# Patient Record
Sex: Female | Born: 1990 | Race: White | Hispanic: No | Marital: Single | Smoking: Never smoker
Health system: Southern US, Community
[De-identification: ages and names within clinical notes are randomized; demographics above are authoritative.]

## PROBLEM LIST (undated history)

## (undated) DIAGNOSIS — N390 Urinary tract infection, site not specified: Secondary | ICD-10-CM

## (undated) DIAGNOSIS — Z820 Family history of epilepsy and other diseases of the nervous system: Secondary | ICD-10-CM

## (undated) HISTORY — DX: Family history of epilepsy and other diseases of the nervous system: Z82.0

## (undated) HISTORY — DX: Urinary tract infection, site not specified: N39.0

---
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
## 2012-01-05 ENCOUNTER — Other Ambulatory Visit: Payer: Self-pay | Admitting: Family Medicine

## 2012-02-06 ENCOUNTER — Telehealth: Payer: Self-pay | Admitting: Family Medicine

## 2012-02-06 NOTE — Telephone Encounter (Signed)
Patient scheduled for an acute appointment with the nurse practitioner on 02/08/12.

## 2012-02-06 NOTE — Telephone Encounter (Signed)
Patient Information:  Caller Name: Anastasya  Phone: [phone]  Patient: Ashley Pitts, Ashley Pitts  Gender: Female  DOB: February 01, 1990  Age: 22 Years  PCP: Evelena Peat (Family Practice)  Pregnant: No  Office Follow Up:  Does the office need to follow up with this patient?: Yes  Instructions For The Office: Pt would like appt on Friday.  Triage nurse not allowed to schedule that far in advance.  Pt said she can come at anytime on Friday.  OFFICE PLEASE CALL PT BACK AT [phone] TO ADVISE IF SHE CAN BE SEEN ON FRIDAY.  Thanks.  RN Note:  Emergent symptoms r/o by Fatigue guidelines with exception of unusually frequent amount of urination or increased thirst.  (See Provider Within 72 Hours).  Pt requesting appt for Friday.  [facility] not able to schedule that far out.  Will send note to office to have them call pt back with possible appt time for Friday.   Symptoms  Reason For Call & Symptoms: Pt calling today 02/06/12 regarding has been episodes of excessive thirst, shakiness, weight loss and the gain back.  Reviewed Health History In EMR: Yes  Reviewed Medications In EMR: Yes  Reviewed Allergies In EMR: Yes  Reviewed Surgeries / Procedures: Yes  Date of Onset of Symptoms: 12/02/2011  Treatments Tried: changed her diet.  Eating more salads and protein  Treatments Tried Worked: No OB / GYN:  LMP: 12/28/2011  Guideline(s) Used:  No Protocol Available - Sick Adult  Disposition Per Guideline:   See Within 3 Days in Office  Reason For Disposition Reached:   Nursing judgment  Advice Given:  N/A

## 2012-02-08 ENCOUNTER — Encounter: Payer: Self-pay | Admitting: Family

## 2012-02-08 ENCOUNTER — Ambulatory Visit (INDEPENDENT_AMBULATORY_CARE_PROVIDER_SITE_OTHER): Payer: BC Managed Care – PPO | Admitting: Family

## 2012-02-08 VITALS — BP 110/60 | HR 90 | Wt 150.0 lb

## 2012-02-08 DIAGNOSIS — Z8659 Personal history of other mental and behavioral disorders: Secondary | ICD-10-CM

## 2012-02-08 DIAGNOSIS — R631 Polydipsia: Secondary | ICD-10-CM

## 2012-02-08 DIAGNOSIS — F411 Generalized anxiety disorder: Secondary | ICD-10-CM

## 2012-02-08 DIAGNOSIS — F419 Anxiety disorder, unspecified: Secondary | ICD-10-CM

## 2012-02-08 DIAGNOSIS — N926 Irregular menstruation, unspecified: Secondary | ICD-10-CM

## 2012-02-08 LAB — POCT URINE PREGNANCY: Preg Test, Ur: NEGATIVE

## 2012-02-08 LAB — GLUCOSE, POCT (MANUAL RESULT ENTRY): POC Glucose: 89 mg/dl (ref 70–99)

## 2012-02-08 MED ORDER — BUPROPION HCL ER (XL) 150 MG PO TB24: 150.0000 mg | ORAL_TABLET | Freq: Every day | ORAL | Status: DC

## 2012-02-08 NOTE — Patient Instructions (Addendum)

## 2012-02-08 NOTE — Progress Notes (Signed)
Subjective:    Patient ID: Ashley Pitts, female    DOB: 25-Feb-1990, 22 y.o.   MRN: [id number]  HPI  Pt is a 22 year old white female,non smoker, patient of Dr. Caryl Never, presents to the office today for pregnancy test; states missed menstrual cycle in Jan. She was sexually active in December, was not on BCP however did use condom for protection. She did have a period in December relating that it was heavier than normal. She began BCP's at the first of January and did not have a period at the end of the January as anticipated. Expressed concern that in the last month she has had increased thirst and increased urination. She has a family history of diabetes; Maternal grandmother and grandfather have diabetes. Mother does not. She verbalizes weight gain since high school which has created anxiety and worry. She has a history of eating disorder that included Bulemia and purging. Last episode March 2012. She sees a Veterinary surgeon with on going therapy for eating disorder. Family history of anxiety with maternal grandparents, sister has anxiety disorder and is on antianxiety medications.   Review of Systems  Constitutional: Negative.   HENT: Negative.   Respiratory: Negative.   Cardiovascular: Negative.   Gastrointestinal: Negative.   Genitourinary: Negative.   Musculoskeletal: Negative.   Skin: Negative.   Neurological: Negative.   Hematological: Negative.   Psychiatric/Behavioral: Negative.    Past Medical History  Diagnosis Date  . Asthma   . FHx: migraine headaches   . UTI (lower urinary tract infection)     History   Social History  . Marital Status: Single    Spouse Name: N/A    Number of Children: N/A  . Years of Education: N/A   Occupational History  . Not on file.   Social History Main Topics  . Smoking status: Never Smoker   . Smokeless tobacco: Not on file  . Alcohol Use: No  . Drug Use: Not on file  . Sexually Active: Not on file   Other Topics Concern  . Not on file    Social History Narrative  . No narrative on file    Past Surgical History  Procedure Date  . Tonsillectomy 1999    Family History  Problem Relation Age of Onset  . Hypertension Father   . Hypertension Maternal Grandmother   . Hyperlipidemia Maternal Grandmother   . Diabetes Maternal Grandmother     type 11  . Hypertension Maternal Grandfather   . Hyperlipidemia Maternal Grandfather   . Diabetes Maternal Grandfather     type ll  . Depression Maternal Grandfather   . Hypertension Paternal Grandmother   . Hypertension Paternal Grandfather     Allergies  Allergen Reactions  . Imitrex (Sumatriptan Succinate)     Tongue swollen, trouble breathing    Current Outpatient Prescriptions on File Prior to Visit  Medication Sig Dispense Refill  . aspirin-acetaminophen-caffeine (EXCEDRIN MIGRAINE) 250-250-65 MG per tablet Take 1 tablet by mouth every 6 (six) hours as needed.        . norethindrone-ethinyl estradiol (JUNEL FE,GILDESS FE,LOESTRIN FE) 1-20 MG-MCG tablet Take 1 tablet by mouth daily.      . RELPAX 40 MG tablet TAKE AS DIRECTED  10 tablet  0  . albuterol (PROAIR HFA) 108 (90 BASE) MCG/ACT inhaler Inhale 2 puffs into the lungs every 6 (six) hours as needed for wheezing.  1 Inhaler  2  . buPROPion (WELLBUTRIN XL) 150 MG 24 hr tablet Take 1 tablet (150  mg total) by mouth daily.  30 tablet  1    BP 110/60  Pulse 90  Wt 150 lb (68.04 kg)  SpO2 97%chart    Objective:   Physical Exam  Constitutional: She is oriented to person, place, and time. She appears well-developed and well-nourished.  Neck: Neck supple.  Cardiovascular: Normal rate, regular rhythm and normal heart sounds.   Pulmonary/Chest: Effort normal and breath sounds normal.  Abdominal: Soft. There is no tenderness. There is no rebound and no guarding.  Neurological: She is alert and oriented to person, place, and time.  Skin: Skin is warm and dry.  Psychiatric: She has a normal mood and affect.    Urine  pregnancy test performed with negative results   Blood sugar obtained with a result of 89-nonfasting (last meal 4 hours ago)      Assessment & Plan:  Assessment:   1. Anxiety Disorder 2. History Eating Disorder    Plan: Discussed anxiety and its effects.Education provided on side effects of BCP and potential weight gain, missing menstrual cycle.  Trial of Welbutrin XL 150 daily times 30 days. Follow up in 3 weeks for effectiveness of medications. Continue psychotherapy.

## 2012-02-29 ENCOUNTER — Ambulatory Visit: Payer: BC Managed Care – PPO | Admitting: Family

## 2012-05-30 ENCOUNTER — Ambulatory Visit (INDEPENDENT_AMBULATORY_CARE_PROVIDER_SITE_OTHER): Payer: BC Managed Care – PPO | Admitting: Family Medicine

## 2012-05-30 ENCOUNTER — Encounter: Payer: Self-pay | Admitting: Family Medicine

## 2012-05-30 VITALS — BP 130/82 | Temp 98.0°F | Wt 149.0 lb

## 2012-05-30 DIAGNOSIS — N390 Urinary tract infection, site not specified: Secondary | ICD-10-CM

## 2012-05-30 DIAGNOSIS — R300 Dysuria: Secondary | ICD-10-CM

## 2012-05-30 LAB — POCT URINALYSIS DIPSTICK
Glucose, UA: NEGATIVE
Nitrite, UA: NEGATIVE
Protein, UA: NEGATIVE
Spec Grav, UA: <=1.005

## 2012-05-30 LAB — POCT URINE PREGNANCY: Preg Test, Ur: NEGATIVE

## 2012-05-30 MED ORDER — NITROFURANTOIN MONOHYD MACRO 100 MG PO CAPS: 100.0000 mg | ORAL_CAPSULE | Freq: Two times a day (BID) | ORAL | Status: DC

## 2012-05-30 MED ORDER — ELETRIPTAN HYDROBROMIDE 40 MG PO TABS: 40.0000 mg | ORAL_TABLET | ORAL | Status: DC | PRN

## 2012-05-30 NOTE — Patient Instructions (Addendum)
Urinary Tract Infection  Urinary tract infections (UTIs) can develop anywhere along your urinary tract. Your urinary tract is your body's drainage system for removing wastes and extra water. Your urinary tract includes two kidneys, two ureters, a bladder, and a urethra. Your kidneys are a pair of bean-shaped organs. Each kidney is about the size of your fist. They are located below your ribs, one on each side of your spine.  CAUSES  Infections are caused by microbes, which are microscopic organisms, including fungi, viruses, and bacteria. These organisms are so small that they can only be seen through a microscope. Bacteria are the microbes that most commonly cause UTIs.  SYMPTOMS   Symptoms of UTIs may vary by age and gender of the patient and by the location of the infection. Symptoms in young women typically include a frequent and intense urge to urinate and a painful, burning feeling in the bladder or urethra during urination. Older women and men are more likely to be tired, shaky, and weak and have muscle aches and abdominal pain. A fever may mean the infection is in your kidneys. Other symptoms of a kidney infection include pain in your back or sides below the ribs, nausea, and vomiting.  DIAGNOSIS  To diagnose a UTI, your caregiver will ask you about your symptoms. Your caregiver also will ask to provide a urine sample. The urine sample will be tested for bacteria and white blood cells. White blood cells are made by your body to help fight infection.  TREATMENT   Typically, UTIs can be treated with medication. Because most UTIs are caused by a bacterial infection, they usually can be treated with the use of antibiotics. The choice of antibiotic and length of treatment depend on your symptoms and the type of bacteria causing your infection.  HOME CARE INSTRUCTIONS   If you were prescribed antibiotics, take them exactly as your caregiver instructs you. Finish the medication even if you feel better after you  have only taken some of the medication.   Drink enough water and fluids to keep your urine clear or pale yellow.   Avoid caffeine, tea, and carbonated beverages. They tend to irritate your bladder.   Empty your bladder often. Avoid holding urine for long periods of time.   Empty your bladder before and after sexual intercourse.   After a bowel movement, women should cleanse from front to back. Use each tissue only once.  SEEK MEDICAL CARE IF:    You have back pain.   You develop a fever.   Your symptoms do not begin to resolve within 3 days.  SEEK IMMEDIATE MEDICAL CARE IF:    You have severe back pain or lower abdominal pain.   You develop chills.   You have nausea or vomiting.   You have continued burning or discomfort with urination.  MAKE SURE YOU:    Understand these instructions.   Will watch your condition.   Will get help right away if you are not doing well or get worse.  Document Released: 09/27/2004 Document Revised: 06/19/2011 Document Reviewed: 01/26/2011  ExitCare Patient Information 2014 [employer].

## 2012-05-30 NOTE — Progress Notes (Signed)
  Subjective:    Patient ID: Ashley Pitts, female    DOB: 04-23-1990, 22 y.o.   MRN: [id number]  HPI UTI symptoms past 3 days Frequency and burning with urination. Denies back pain. No nausea or vomiting. No abdominal pain. No fever or chills. No known drug allergies other than intolerance to Imitrex  Also requesting pregnancy test. She does take birth control that had one episode of intercourse 3 weeks ago. Last menstrual period was about 3 weeks ago and normal   Review of Systems  Constitutional: Negative for fever, chills and appetite change.  Gastrointestinal: Negative for nausea, vomiting, abdominal pain, diarrhea and constipation.  Genitourinary: Positive for dysuria and frequency.  Musculoskeletal: Negative for back pain.  Neurological: Negative for dizziness.       Objective:   Physical Exam  Constitutional: She appears well-developed and well-nourished.  HENT:  Head: Normocephalic and atraumatic.  Neck: Neck supple. No thyromegaly present.  Cardiovascular: Normal rate, regular rhythm and normal heart sounds.   Pulmonary/Chest: Breath sounds normal.  Abdominal: Soft. Bowel sounds are normal. There is no tenderness.          Assessment & Plan:  Uncomplicated cystitis. Macrobid one twice a day for 3 days. Plenty of fluids. Urine pregnancy negative.

## 2012-06-02 ENCOUNTER — Telehealth: Payer: Self-pay | Admitting: Family Medicine

## 2012-06-02 NOTE — Telephone Encounter (Signed)
[facility] Triage Call Report Triage Record Num: [id number] Operator: Jeraldine Loots Patient Name: Ashley Pitts Call Date & Time: 05/31/2012 8:35:27AM Patient Phone: [phone] PCP: Patient Gender: Female PCP Fax : Patient DOB: 1990/10/22 [facility] Name: [facility] Reason for Call: Caller: Joy/Mother; PCP: Evelena Peat (Family Practice); CB#: [phone]. Was seen in the office on Friday 5/30 and dx with UTI. Started on Macrodantin. This am has extreme lower back pain, radiating into her upper back. She cancelled a flight to [place] because she is feeling so bad. She is asking for IV's and pain medications. At the time of the call, mom had her in the car going to UC. I checked for an appt. at [facility] and the earliest available is 1215. Mom is going to take her on to UC now for evaluation. Protocol(s) Used: Office Note Recommended Outcome per Protocol: Information Noted and Sent to Office Reason for Outcome: Caller information to office Care Advice: ~ 05/

## 2012-07-01 ENCOUNTER — Telehealth: Payer: Self-pay | Admitting: Family Medicine

## 2012-07-01 NOTE — Telephone Encounter (Signed)
Mom following up on letter faxed to MD for pt's school in reference to her migraines.  Pls advise.

## 2012-07-01 NOTE — Telephone Encounter (Signed)
Called pt mother to clarify the letter she is talking about

## 2012-07-03 ENCOUNTER — Other Ambulatory Visit: Payer: Self-pay

## 2012-07-03 NOTE — Telephone Encounter (Signed)
Have form.

## 2012-07-09 ENCOUNTER — Encounter: Payer: Self-pay | Admitting: Family Medicine

## 2012-11-08 ENCOUNTER — Emergency Department (HOSPITAL_BASED_OUTPATIENT_CLINIC_OR_DEPARTMENT_OTHER): Payer: BC Managed Care – PPO

## 2012-11-08 ENCOUNTER — Emergency Department (HOSPITAL_BASED_OUTPATIENT_CLINIC_OR_DEPARTMENT_OTHER)
Admission: EM | Admit: 2012-11-08 | Discharge: 2012-11-08 | Disposition: A | Discharge status: Home / Self Care | Payer: BC Managed Care – PPO | Attending: Emergency Medicine | Admitting: Emergency Medicine

## 2012-11-08 ENCOUNTER — Encounter (HOSPITAL_BASED_OUTPATIENT_CLINIC_OR_DEPARTMENT_OTHER): Payer: Self-pay | Admitting: Emergency Medicine

## 2012-11-08 DIAGNOSIS — N12 Tubulo-interstitial nephritis, not specified as acute or chronic: Secondary | ICD-10-CM | POA: Insufficient documentation

## 2012-11-08 DIAGNOSIS — J45909 Unspecified asthma, uncomplicated: Secondary | ICD-10-CM | POA: Insufficient documentation

## 2012-11-08 DIAGNOSIS — Z87440 Personal history of urinary (tract) infections: Secondary | ICD-10-CM | POA: Insufficient documentation

## 2012-11-08 DIAGNOSIS — R1031 Right lower quadrant pain: Secondary | ICD-10-CM | POA: Insufficient documentation

## 2012-11-08 DIAGNOSIS — Z3202 Encounter for pregnancy test, result negative: Secondary | ICD-10-CM | POA: Insufficient documentation

## 2012-11-08 DIAGNOSIS — R110 Nausea: Secondary | ICD-10-CM | POA: Insufficient documentation

## 2012-11-08 DIAGNOSIS — J029 Acute pharyngitis, unspecified: Secondary | ICD-10-CM | POA: Insufficient documentation

## 2012-11-08 DIAGNOSIS — Z79899 Other long term (current) drug therapy: Secondary | ICD-10-CM | POA: Insufficient documentation

## 2012-11-08 LAB — CBC WITH DIFFERENTIAL/PLATELET
Basophils Absolute: 0 10*3/uL (ref 0.0–0.1)
Eosinophils Absolute: 0 10*3/uL (ref 0.0–0.7)
Eosinophils Relative: 0 % (ref 0–5)
HCT: 39.9 % (ref 36.0–46.0)
Hemoglobin: 13.4 g/dL (ref 12.0–15.0)
Lymphocytes Relative: 15 % (ref 12–46)
Lymphs Abs: 1.8 10*3/uL (ref 0.7–4.0)
MCV: 88.1 fL (ref 78.0–100.0)
Monocytes Absolute: 1.2 10*3/uL — ABNORMAL HIGH (ref 0.1–1.0)
Monocytes Relative: 10 % (ref 3–12)
Neutro Abs: 8.6 10*3/uL — ABNORMAL HIGH (ref 1.7–7.7)
RBC: 4.53 MIL/uL (ref 3.87–5.11)
RDW: 11.8 % (ref 11.5–15.5)
WBC: 11.7 10*3/uL — ABNORMAL HIGH (ref 4.0–10.5)

## 2012-11-08 LAB — BASIC METABOLIC PANEL
BUN: 6 mg/dL (ref 6–23)
CO2: 26 mEq/L (ref 19–32)
Calcium: 10.1 mg/dL (ref 8.4–10.5)
Chloride: 98 mEq/L (ref 96–112)
Creatinine, Ser: 0.8 mg/dL (ref 0.50–1.10)
GFR calc non Af Amer: >90 mL/min (ref >90)
Glucose, Bld: 91 mg/dL (ref 70–99)

## 2012-11-08 LAB — URINALYSIS, ROUTINE W REFLEX MICROSCOPIC
Bilirubin Urine: NEGATIVE
Glucose, UA: NEGATIVE mg/dL
Hgb urine dipstick: NEGATIVE
Specific Gravity, Urine: 1.013 (ref 1.005–1.030)
Urobilinogen, UA: 0.2 mg/dL (ref 0.0–1.0)
pH: 7 (ref 5.0–8.0)

## 2012-11-08 LAB — RAPID STREP SCREEN (MED CTR MEBANE ONLY): Streptococcus, Group A Screen (Direct): NEGATIVE

## 2012-11-08 MED ORDER — SODIUM CHLORIDE 0.9 % IV BOLUS (SEPSIS)
1000.0000 mL | Freq: Once | INTRAVENOUS | Status: AC
  Administered 2012-11-08: 1000 mL via INTRAVENOUS

## 2012-11-08 MED ORDER — MORPHINE SULFATE 4 MG/ML IJ SOLN: 4.0000 mg | INTRAMUSCULAR | Status: DC | PRN

## 2012-11-08 MED ORDER — CEPHALEXIN 500 MG PO CAPS: 500.0000 mg | ORAL_CAPSULE | Freq: Two times a day (BID) | ORAL | Status: DC

## 2012-11-08 MED ORDER — LIDOCAINE VISCOUS 2 % MT SOLN
15.0000 mL | Freq: Once | OROMUCOSAL | Status: AC
  Administered 2012-11-08: 15 mL via OROMUCOSAL
  Filled 2012-11-08: qty 15

## 2012-11-08 MED ORDER — MORPHINE SULFATE 4 MG/ML IJ SOLN: 4.0000 mg | Freq: Once | INTRAMUSCULAR | Status: DC

## 2012-11-08 MED ORDER — ONDANSETRON 8 MG PO TBDP: 8.0000 mg | ORAL_TABLET | Freq: Three times a day (TID) | ORAL | Status: DC | PRN

## 2012-11-08 MED ORDER — IOHEXOL 300 MG/ML  SOLN
50.0000 mL | Freq: Once | INTRAMUSCULAR | Status: AC | PRN
  Administered 2012-11-08: 50 mL via ORAL

## 2012-11-08 MED ORDER — IOHEXOL 300 MG/ML  SOLN
100.0000 mL | Freq: Once | INTRAMUSCULAR | Status: AC | PRN
  Administered 2012-11-08: 100 mL via INTRAVENOUS

## 2012-11-08 MED ORDER — ONDANSETRON HCL 4 MG/2ML IJ SOLN
4.0000 mg | Freq: Once | INTRAMUSCULAR | Status: AC
  Administered 2012-11-08: 4 mg via INTRAVENOUS
  Filled 2012-11-08: qty 2

## 2012-11-08 NOTE — ED Provider Notes (Signed)
CSN: [id number]     Arrival date & time 11/08/12  0902 History   First MD Initiated Contact with Patient 11/08/12 0902     Chief Complaint  Patient presents with  . Urinary Tract Infection  . Sore Throat   (Consider location/radiation/quality/duration/timing/severity/associated sxs/prior Treatment) HPI Comments: Pt comes in with cc of abd pain, back pain. Pt is being treated with macrobid right now, and developed back pain and right sided lower quadrant pain - so she came to the ED. She has dysuria, polyuria, and nausea. She denies any fevers, chills - just feels sick. Pt has no hx of renal stones, and she denies vaginal discharge, or bleeding.  Patient is a 22 y.o. female presenting with urinary tract infection and pharyngitis. The history is provided by the patient.  Urinary Tract Infection Associated symptoms include abdominal pain. Pertinent negatives include no chest pain, no headaches and no shortness of breath.  Sore Throat Associated symptoms include abdominal pain. Pertinent negatives include no chest pain, no headaches and no shortness of breath.    Past Medical History  Diagnosis Date  . Asthma   . FHx: migraine headaches   . UTI (lower urinary tract infection)    Past Surgical History  Procedure Laterality Date  . Tonsillectomy  1999   Family History  Problem Relation Age of Onset  . Hypertension Father   . Hypertension Maternal Grandmother   . Hyperlipidemia Maternal Grandmother   . Diabetes Maternal Grandmother     type 11  . Hypertension Maternal Grandfather   . Hyperlipidemia Maternal Grandfather   . Diabetes Maternal Grandfather     type ll  . Depression Maternal Grandfather   . Hypertension Paternal Grandmother   . Hypertension Paternal Grandfather    History  Substance Use Topics  . Smoking status: Never Smoker   . Smokeless tobacco: Not on file  . Alcohol Use: No   OB History   Grav Para Term Preterm Abortions TAB SAB Ect Mult Living                  Review of Systems  Constitutional: Negative for fever, chills and activity change.  Respiratory: Negative for cough, shortness of breath and wheezing.   Cardiovascular: Negative for chest pain.  Gastrointestinal: Positive for nausea and abdominal pain. Negative for vomiting, diarrhea, constipation, blood in stool and abdominal distention.  Genitourinary: Positive for dysuria, frequency, hematuria and flank pain. Negative for difficulty urinating.  Musculoskeletal: Negative for neck pain.  Skin: Negative for color change.  Neurological: Negative for speech difficulty and headaches.  Hematological: Does not bruise/bleed easily.  Psychiatric/Behavioral: Negative for confusion.    Allergies  Imitrex  Home Medications   Current Outpatient Rx  Name  Route  Sig  Dispense  Refill  . aspirin-acetaminophen-caffeine (EXCEDRIN MIGRAINE) 250-250-65 MG per tablet   Oral   Take 1 tablet by mouth every 6 (six) hours as needed.           . cephALEXin (KEFLEX) 500 MG capsule   Oral   Take 1 capsule (500 mg total) by mouth 2 (two) times daily.   20 capsule   0   . drospirenone-ethinyl estradiol (YAZ,GIANVI,LORYNA) 3-0.02 MG tablet   Oral   Take 1 tablet by mouth daily.         Marland Kitchen eletriptan (RELPAX) 40 MG tablet   Oral   One tablet by mouth at onset of headache. May repeat in 2 hours if headache persists or recurs. may repeat in  2 hours if necessary   10 tablet   11   . nitrofurantoin, macrocrystal-monohydrate, (MACROBID) 100 MG capsule   Oral   Take 100 mg by mouth 2 (two) times daily.         . ondansetron (ZOFRAN ODT) 8 MG disintegrating tablet   Oral   Take 1 tablet (8 mg total) by mouth every 8 (eight) hours as needed for nausea.   20 tablet   0    BP 106/77  Pulse 99  Temp(Src) 98.4 F (36.9 C) (Oral)  Resp 18  Ht 5\' 4"  (1.626 m)  Wt 140 lb (63.504 kg)  BMI 24.02 kg/m2  SpO2 97%  LMP 11/03/2012 Physical Exam  Nursing note and vitals  reviewed. Constitutional: She is oriented to person, place, and time. She appears well-developed and well-nourished.  HENT:  Head: Normocephalic and atraumatic.  Eyes: EOM are normal. Pupils are equal, round, and reactive to light.  Neck: Neck supple.  Cardiovascular: Normal rate, regular rhythm and normal heart sounds.   No murmur heard. Pulmonary/Chest: Effort normal. No respiratory distress.  Abdominal: Soft. She exhibits no distension. There is tenderness. There is no rebound and no guarding.  Periumbilical and RLQ tenderness, + guarding, no rebound.   Neurological: She is alert and oriented to person, place, and time.  Skin: Skin is warm and dry.    ED Course  Procedures (including critical care time) Labs Review Labs Reviewed  URINALYSIS, ROUTINE W REFLEX MICROSCOPIC - Abnormal; Notable for the following:    Color, Urine GREEN (*)    All other components within normal limits  CBC WITH DIFFERENTIAL - Abnormal; Notable for the following:    WBC 11.7 (*)    Neutro Abs 8.6 (*)    Monocytes Absolute 1.2 (*)    All other components within normal limits  RAPID STREP SCREEN  CULTURE, GROUP A STREP  URINE CULTURE  PREGNANCY, URINE  BASIC METABOLIC PANEL   Imaging Review Ct Abdomen Pelvis W Contrast  11/08/2012   CLINICAL DATA:  Right lower quadrant abdominal pain, fever, urinary tract infection and elevated white blood cell count.  EXAM: CT ABDOMEN AND PELVIS WITH CONTRAST  TECHNIQUE: Multidetector CT imaging of the abdomen and pelvis was performed using the standard protocol following bolus administration of intravenous contrast.  CONTRAST:  50mL OMNIPAQUE IOHEXOL 300 MG/ML SOLN, OMNIPAQUE IOHEXOL 300 MG/ML SOLN  COMPARISON:  11/08/2008  FINDINGS: There is no evidence by CT of acute appendicitis. A few small right lower quadrant mesenteric lymph nodes are identified which are nonenlarged. No acute inflammatory process is identified in the abdomen or pelvis.  The liver,  gallbladder, pancreas, spleen, adrenal glands and kidneys have a normal appearance. No hydronephrosis or renal calculi are identified. The bladder is moderately distended. Uterus and adnexal regions are unremarkable by CT. No incidental masses or enlarged lymph nodes are seen. No hernias are identified. Bony structures are within normal limits.  IMPRESSION: No acute findings. No evidence of acute appendicitis. Moderately distended urinary bladder.   Electronically Signed   By: Irish Lack M.D.   On: 11/08/2012 12:48    EKG Interpretation   None       MDM   1. Pyelonephritis    DDx includes: Intra abdominal abscess Thrombosis Mesenteric ischemia Diverticulitis Peritonitis Appendicitis Hernia Nephrolithiasis Pyelonephritis UTI/Cystitis Ovarian cyst TOA Ectopic pregnancy  Pt comes in with cc of abd pain. Has UTI like sx, and associated back pain. Pt's exam reveals periumbilical, RLQ and flank tenderness.  CT abd ordered  - and there is no appendicitis.  Will treat as pyelo.          Derwood Kaplan, MD 11/08/12 1529

## 2012-11-08 NOTE — ED Notes (Signed)
Patient states she had chills last night, hurts to swallow/, no cough,no sinus drainage

## 2012-11-08 NOTE — ED Notes (Signed)
Pt being treated for UTI since Thursday.  Pt continues to have symptoms of back pain, fever, and sore throat.

## 2012-11-08 NOTE — ED Notes (Signed)
Patient not drinking oral contrast, she states her throat hurts to swallow.  I spoke to Dr Rhunette Croft and he will order something to numb her throat so she can drink the contrast.  Patients mom states she had a CT in June for same symptoms and she did not drink oral contrast and CT was negative, mom wants daughter to drink oral contrast today.  Ashley Pitts

## 2012-11-09 LAB — URINE CULTURE: Culture: NO GROWTH

## 2012-11-10 LAB — CULTURE, GROUP A STREP

## 2013-10-29 ENCOUNTER — Encounter: Payer: Self-pay | Admitting: Family Medicine

## 2013-10-29 ENCOUNTER — Ambulatory Visit (INDEPENDENT_AMBULATORY_CARE_PROVIDER_SITE_OTHER): Payer: BC Managed Care – PPO | Admitting: Family Medicine

## 2013-10-29 VITALS — BP 110/70 | HR 80 | Wt 142.0 lb

## 2013-10-29 DIAGNOSIS — R1084 Generalized abdominal pain: Secondary | ICD-10-CM

## 2013-10-29 DIAGNOSIS — R109 Unspecified abdominal pain: Secondary | ICD-10-CM

## 2013-10-29 DIAGNOSIS — R7401 Elevation of levels of liver transaminase levels: Secondary | ICD-10-CM

## 2013-10-29 DIAGNOSIS — G43809 Other migraine, not intractable, without status migrainosus: Secondary | ICD-10-CM

## 2013-10-29 DIAGNOSIS — R740 Nonspecific elevation of levels of transaminase and lactic acid dehydrogenase [LDH]: Secondary | ICD-10-CM

## 2013-10-29 MED ORDER — ELETRIPTAN HYDROBROMIDE 40 MG PO TABS: 40.0000 mg | ORAL_TABLET | ORAL | Status: DC | PRN

## 2013-10-29 NOTE — Progress Notes (Signed)
Subjective:    Patient ID: Ashley MiyamotoMegan E Stepka, female    DOB: 06/15/1990, 23 y.o.   MRN: [id number]  HPI 23 year old female presents after ER visit last night.  She has been having chronic abdominal symptoms for 2 years.  This episode started 4 days ago while she was out of town.  Began as nausea and headaches.  Progressed to right sided pain, stomach aches and pain in the back.  She has ben back to GYN to check the recent placement of IUD for which she has never used; placement was good and no acute GYN issues.  Yesterday the pain spiked to severe sharp pain, diarrhea, fevers, chills, nausea and vomiting.  Denies melena or hematochezia.  The pain took her to the ER where they ruled out acute appendicitis on CT.  No other acute process going on, no WBC elevation, all other labs normal.  Slight elevation in LFTs.  She was then sent home.  Today the pain is much better, 3/10.  She feels dehydrated with dark urine, but is able to keep liquids and solids down.   She has also been having more frequent migraines at 2/ wk.  She tries her regular routine which will work, but her stress level is much higher this semester at school.       Review of Systems  Constitutional: Positive for fever (fever last night of 100.  None noted today.), chills, activity change and appetite change.  Eyes: Negative for visual disturbance.  Respiratory: Negative for cough, chest tightness and shortness of breath.   Cardiovascular: Negative for chest pain.  Gastrointestinal: Positive for nausea, vomiting (no hemetemasis ), abdominal pain (Right sided) and diarrhea. Negative for constipation, blood in stool and abdominal distention.  Genitourinary: Negative.   Musculoskeletal: Positive for back pain. Negative for gait problem, joint swelling and myalgias.  Skin: Negative for rash.       Denies itching.  Neurological: Positive for headaches. Negative for dizziness, syncope, weakness, light-headedness and numbness.         Objective:   Physical Exam  Nursing note and vitals reviewed. Constitutional: She is oriented to person, place, and time. She appears well-developed and well-nourished. No distress.  HENT:  Head: Normocephalic and atraumatic.  Cardiovascular: Normal rate, regular rhythm and normal heart sounds.   No murmur heard. Pulmonary/Chest: Effort normal and breath sounds normal. No respiratory distress. She has no wheezes.  Abdominal: Soft. Bowel sounds are normal. She exhibits no distension and no mass. There is tenderness (Diffusely tender; mostly in RUQ.). There is no rebound and no guarding.  Slight Murphy's sign.  No McBurney's point tenderness.  No obturator or psoas sign.    Neurological: She is alert and oriented to person, place, and time.  Skin: Skin is warm and dry. No rash noted. She is not diaphoretic.  Psychiatric: She has a normal mood and affect. Her behavior is normal. Judgment and thought content normal.        Assessment & Plan:  1.  Abdominal pain- CT showed no acute process; including appendicitis or ovarian origin.  No WBC count.  LFTs slightly elevated; will recheck these in a few months for resolution.  Patient agrees to watch the pain since symptoms seem to be resolving.  If another spell occurs, or this gets worse, referral to GI was discussed.  Encouraged to stay well hydrated. 2.  Migraines well controlled on relpax plus tylenol.  Discussed avoiding triggers.  Stress this semester seems to be  the cause, and lack of proper sleep.  Discussed other treatments for prevention.  She wishes to watch through this semester and re-evaluate.  Will return in December to follow up and will see if tricyclic antidepressant at low dose is necessary for prevention of migraine.      Luiz OchoaMelissa Veeda Virgo, PA-S  As above.  She describes intermittent episodes of similar abdominal pain in past with workup unrevealing.  Benign exam at this time.  Recent labs and x-rays reviewed.  She has not had any  appetite or weight changes.  Nonspecific mild elevation of liver transaminases and recommend repeat these when she is home for break at end of semester. Her abdominal pain appears to be resolving from this episode.  GB appeared normal on CT.    Evelena PeatBruce Burchette MD

## 2013-10-29 NOTE — Progress Notes (Signed)
Pre visit review using our clinic review tool, if applicable. No additional management support is needed unless otherwise documented below in the visit note. 

## 2013-10-29 NOTE — Patient Instructions (Signed)

## 2013-11-05 ENCOUNTER — Telehealth: Payer: Self-pay | Admitting: Family Medicine

## 2013-11-05 ENCOUNTER — Other Ambulatory Visit: Payer: Self-pay | Admitting: Family Medicine

## 2013-11-05 DIAGNOSIS — R1030 Lower abdominal pain, unspecified: Secondary | ICD-10-CM

## 2013-11-05 NOTE — Telephone Encounter (Signed)
Can you see if patient would like appt her to check for UTI. Referral is ordered.

## 2013-11-05 NOTE — Telephone Encounter (Signed)
We can have her see urologist but if she is currently having symptoms we should offer to see her tomorrow first and check urine to rule out UTI. We would not be able to get her in to see urologist tomorrow anyway.

## 2013-11-05 NOTE — Telephone Encounter (Signed)
Lm on vm to cb °

## 2013-11-05 NOTE — Telephone Encounter (Signed)
Mom states pt is in ED about every other month. Pt has constant uti's (sees obgyn when this happens) they thought it was female related at first.  Pt has pain in lower abdomen and moves to back. When she has episodes she has fever. Pt  would like a referral to a urologist. Pt is having an episode now and mom states its really bad. Pt is at school today [employer]( [employer]) and coming home tomorrow. Mom states pt has been missing a lot of school. pls advise

## 2013-11-11 NOTE — Telephone Encounter (Signed)
Attempted tp call pt back several times. Got vm

## 2014-06-21 ENCOUNTER — Ambulatory Visit: Payer: Self-pay | Admitting: Family Medicine

## 2015-03-25 ENCOUNTER — Ambulatory Visit (INDEPENDENT_AMBULATORY_CARE_PROVIDER_SITE_OTHER): Payer: BLUE CROSS/BLUE SHIELD | Admitting: Family Medicine

## 2015-03-25 VITALS — BP 108/80 | HR 101 | Temp 98.4°F | Ht 64.0 in | Wt 158.3 lb

## 2015-03-25 DIAGNOSIS — R1084 Generalized abdominal pain: Secondary | ICD-10-CM

## 2015-03-25 DIAGNOSIS — R109 Unspecified abdominal pain: Secondary | ICD-10-CM

## 2015-03-25 NOTE — Patient Instructions (Signed)
We will set up allergy referral.

## 2015-03-25 NOTE — Progress Notes (Signed)
Subjective:    Patient ID: Ashley MiyamotoMegan E Pitts, female    DOB: 10/04/1990, 25 y.o.   MRN: [id number]  HPI Patient seen for intermittent abdominal pain. For the past few years she has had episodes of transient abdominal pain which usually last about 1 week duration then resolved. She states this generally happens about 2 times per year. Her pain is somewhat nonspecific and just below the umbilicus region and spread somewhat bilateral. Most recent episode was February 28 of this year. [facility] [place]. Lab work including CBC and comprehensive metabolic panel and urinalysis unremarkable. Patient had chlamydia and GC screening per her gynecologist just 1 week prior to that which were normal. She has IUD in place. Etiology was unclear.  She had similar episodes back in 2014 and 2015 and had CT abdomen/ pelvis on both occasions which were unremarkable. She does think that stress may play a role with episodes. She has not noted change of bowel habits. Only occasional loose stools. No associated fevers or chills. No dysuria.  Has noted recently that eggs may be triggering some of her abdominal pain. On at least 2 occasions after eating eggs she noticed similar type pain. No history of skin rash. Never tested for food allergies. No recent appetite or weight changes. No family history of GI disorder.  Past Medical History  Diagnosis Date  . Asthma   . FHx: migraine headaches   . UTI (lower urinary tract infection)    Past Surgical History  Procedure Laterality Date  . Tonsillectomy  1999    reports that she has never smoked. She does not have any smokeless tobacco history on file. She reports that she does not drink alcohol. Her drug history is not on file. family history includes Depression in her maternal grandfather; Diabetes in her maternal grandfather and maternal grandmother; Hyperlipidemia in her maternal grandfather and maternal grandmother; Hypertension in her father,  maternal grandfather, maternal grandmother, paternal grandfather, and paternal grandmother. Allergies  Allergen Reactions  . Imitrex [Sumatriptan Succinate]     Tongue swollen, trouble breathing  . Minocycline       Review of Systems  Constitutional: Negative for fever, appetite change, fatigue and unexpected weight change.  HENT: Negative for congestion.   Respiratory: Negative for cough, shortness of breath and wheezing.   Cardiovascular: Negative for chest pain.  Gastrointestinal: Positive for abdominal pain (As per history of present illness. None currently). Negative for nausea, vomiting, constipation and blood in stool.  Genitourinary: Negative for dysuria.  Musculoskeletal: Negative for back pain.  Skin: Negative for rash.  Hematological: Negative for adenopathy. Does not bruise/bleed easily.       Objective:   Physical Exam  Constitutional: She appears well-developed and well-nourished.  HENT:  Right Ear: External ear normal.  Left Ear: External ear normal.  Mouth/Throat: Oropharynx is clear and moist.  Neck: Neck supple. No thyromegaly present.  Cardiovascular: Normal rate and regular rhythm.  Exam reveals no gallop.   No murmur heard. Pulmonary/Chest: Effort normal and breath sounds normal. No respiratory distress. She has no wheezes. She has no rales.  Abdominal: Soft. Bowel sounds are normal. She exhibits no distension and no mass. There is no tenderness. There is no rebound and no guarding.  Musculoskeletal: She exhibits no edema.  Lymphadenopathy:    She has no cervical adenopathy.  Skin: No rash noted.          Assessment & Plan:  #1 episodic nonspecific periumbilical  abdominal pain. She's had multiple ER visits and multiple scans as above unremarkable. We discussed importance of avoiding unnecessary radiation from repeat CT scans. Exam is nonfocal at this time. Patient has noticed recent correlation of symptoms following egg ingestion. Never tested for  food allergies.  She has not had any weight loss or other symptoms to suggest likely inflammatory bowel. We did discuss possible upper GI with small bowel follow-through for any recurrent symptoms. We also discussed possible allergy referral to look for possible food allergies.

## 2015-03-25 NOTE — Progress Notes (Signed)
Pre visit review using our clinic review tool, if applicable. No additional management support is needed unless otherwise documented below in the visit note. 

## 2016-03-13 DIAGNOSIS — N3000 Acute cystitis without hematuria: Secondary | ICD-10-CM | POA: Diagnosis not present

## 2016-04-04 ENCOUNTER — Encounter: Payer: Self-pay | Admitting: Family Medicine

## 2016-04-04 ENCOUNTER — Ambulatory Visit (INDEPENDENT_AMBULATORY_CARE_PROVIDER_SITE_OTHER): Payer: BLUE CROSS/BLUE SHIELD | Admitting: Family Medicine

## 2016-04-04 VITALS — BP 100/68 | HR 68 | Temp 98.3°F | Wt 158.7 lb

## 2016-04-04 DIAGNOSIS — G43809 Other migraine, not intractable, without status migrainosus: Secondary | ICD-10-CM

## 2016-04-04 DIAGNOSIS — F419 Anxiety disorder, unspecified: Secondary | ICD-10-CM | POA: Diagnosis not present

## 2016-04-04 MED ORDER — SERTRALINE HCL 50 MG PO TABS: 50.0000 mg | ORAL_TABLET | Freq: Every day | ORAL | 3 refills | Status: AC

## 2016-04-04 MED ORDER — ELETRIPTAN HYDROBROMIDE 40 MG PO TABS: 40.0000 mg | ORAL_TABLET | ORAL | 11 refills | Status: DC | PRN

## 2016-04-04 NOTE — Progress Notes (Signed)
Pre visit review using our clinic review tool, if applicable. No additional management support is needed unless otherwise documented below in the visit note. 

## 2016-04-04 NOTE — Patient Instructions (Signed)
Start Sertraline 50 mg one half tablet daily for 4 days and then increase to one tablet daily Let's plan on follow up in 3-4 weeks.

## 2016-04-04 NOTE — Progress Notes (Signed)
Subjective:     Patient ID: Ashley Pitts, female   DOB: [birth year], 26 y.o.   MRN: [id number]  HPI Patient seen for the following issues:  History of migraine headaches. She takes Relpax. She was previously on Topamax but had side effects. Relpax generally works well to abort her headaches quickly.  ?stress triggers.  She relates increased anxiety symptoms over recent months. She has past history of eating disorder with bulimia and has had extensive counseling in the past. Denies any current bulimia issues. She's getting ready to start grad school this fall and is finishing up a research project currently at [employer]. She's had frequent early morning awakening. Sleep disruption. She has had increase in checking type behaviors. For example, she gets up several times per night to check her windows and door to make sure they are locked even though she logically knows they are locked. She also gets up several times per night check her alarms. She denies feeling sad or depressed mood though her PHQ-9 score today was 18.  She's had extensive counseling in the past regarding anxiety and stress management and is requesting referral for that.  Past Medical History:  Diagnosis Date  . Asthma   . FHx: migraine headaches   . UTI (lower urinary tract infection)    Past Surgical History:  Procedure Laterality Date  . TONSILLECTOMY  1999    reports that she has never smoked. She has never used smokeless tobacco. She reports that she does not drink alcohol. Her drug history is not on file. family history includes Depression in her maternal grandfather; Diabetes in her maternal grandfather and maternal grandmother; Hyperlipidemia in her maternal grandfather and maternal grandmother; Hypertension in her father, maternal grandfather, maternal grandmother, paternal grandfather, and paternal grandmother. Allergies  Allergen Reactions  . Imitrex [Sumatriptan Succinate]     Tongue swollen, trouble breathing   . Minocycline      Review of Systems  Constitutional: Negative for fatigue.  Eyes: Negative for visual disturbance.  Respiratory: Negative for cough, chest tightness, shortness of breath and wheezing.   Cardiovascular: Negative for chest pain, palpitations and leg swelling.  Neurological: Negative for dizziness, seizures, syncope, weakness, light-headedness and headaches.  Psychiatric/Behavioral: Positive for sleep disturbance. Negative for dysphoric mood and suicidal ideas. The patient is nervous/anxious.        Objective:   Physical Exam  Constitutional: She appears well-developed and well-nourished.  Eyes: Pupils are equal, round, and reactive to light.  Neck: Neck supple. No JVD present. No thyromegaly present.  Cardiovascular: Normal rate and regular rhythm.  Exam reveals no gallop.   Pulmonary/Chest: Effort normal and breath sounds normal. No respiratory distress. She has no wheezes. She has no rales.  Musculoskeletal: She exhibits no edema.  Neurological: She is alert. No cranial nerve deficit.  Psychiatric: She has a normal mood and affect. Her behavior is normal. Judgment and thought content normal.       Assessment:     #1 migraine headaches currently stable  #2 anxiety disorder. She has history of eating disorder bulimia and is describing some definite obsessive-compulsive disorder traits    Plan:     -Recommend referral for cognitive behavioral therapy -Start sertraline 50 mgs once daily and reassess in 3-4 weeks -We discussed also nonpharmacologic strategies for managing anxiety. She just recently started new exercise program. -Refill Relpax for as needed use  Kristian Covey MD [facility]

## 2016-04-09 ENCOUNTER — Encounter: Payer: Self-pay | Admitting: Family Medicine

## 2016-04-09 ENCOUNTER — Telehealth: Payer: Self-pay | Admitting: Family Medicine

## 2016-04-09 NOTE — Telephone Encounter (Signed)
° ° °  Pt call to ask if  Dr Caryl Never had written a letter for her. She said they discussed it in her last visit.

## 2016-04-09 NOTE — Telephone Encounter (Signed)
Done

## 2016-04-10 NOTE — Telephone Encounter (Signed)
Attempted to call patient but the mailbox is full.  Letter ready for pick up and placed upfront.

## 2016-04-26 ENCOUNTER — Ambulatory Visit (INDEPENDENT_AMBULATORY_CARE_PROVIDER_SITE_OTHER): Payer: BLUE CROSS/BLUE SHIELD | Admitting: Psychology

## 2016-04-26 DIAGNOSIS — F429 Obsessive-compulsive disorder, unspecified: Secondary | ICD-10-CM | POA: Diagnosis not present

## 2016-04-26 DIAGNOSIS — F411 Generalized anxiety disorder: Secondary | ICD-10-CM | POA: Diagnosis not present

## 2016-05-02 ENCOUNTER — Ambulatory Visit: Payer: BLUE CROSS/BLUE SHIELD | Admitting: Family Medicine

## 2016-05-07 ENCOUNTER — Encounter: Payer: Self-pay | Admitting: Family Medicine

## 2016-05-07 ENCOUNTER — Ambulatory Visit (INDEPENDENT_AMBULATORY_CARE_PROVIDER_SITE_OTHER): Payer: BLUE CROSS/BLUE SHIELD | Admitting: Family Medicine

## 2016-05-07 ENCOUNTER — Ambulatory Visit (INDEPENDENT_AMBULATORY_CARE_PROVIDER_SITE_OTHER): Payer: BLUE CROSS/BLUE SHIELD | Admitting: Psychology

## 2016-05-07 VITALS — BP 102/70 | HR 102 | Temp 98.0°F | Wt 155.0 lb

## 2016-05-07 DIAGNOSIS — F422 Mixed obsessional thoughts and acts: Secondary | ICD-10-CM | POA: Diagnosis not present

## 2016-05-07 DIAGNOSIS — F419 Anxiety disorder, unspecified: Secondary | ICD-10-CM

## 2016-05-07 NOTE — Progress Notes (Signed)
Subjective:     Patient ID: Ashley MiyamotoMegan E Nienhuis, female   DOB: 03/07/1990, 26 y.o.   MRN: [id number]  HPI Patient seen for follow-up regarding anxiety symptoms. Refer to previous note. She has some definite OCD checking type behaviors. She has initiated cognitive behavioral therapy. We started sertraline 50 mg once daily and she thinks this has helped some. She had some mild nausea initially but that has resolved at this point. Overall she is pleased. She had some mild fatigue initially but seems to of resolved as well.  Past Medical History:  Diagnosis Date  . Asthma   . FHx: migraine headaches   . UTI (lower urinary tract infection)    Past Surgical History:  Procedure Laterality Date  . TONSILLECTOMY  1999    reports that she has never smoked. She has never used smokeless tobacco. She reports that she does not drink alcohol. Her drug history is not on file. family history includes Depression in her maternal grandfather; Diabetes in her maternal grandfather and maternal grandmother; Hyperlipidemia in her maternal grandfather and maternal grandmother; Hypertension in her father, maternal grandfather, maternal grandmother, paternal grandfather, and paternal grandmother. Allergies  Allergen Reactions  . Imitrex [Sumatriptan Succinate]     Tongue swollen, trouble breathing  . Minocycline      Review of Systems  Constitutional: Negative for appetite change, fatigue and unexpected weight change.  Respiratory: Negative for shortness of breath.   Psychiatric/Behavioral: Negative for agitation, dysphoric mood and sleep disturbance.       Objective:   Physical Exam  Constitutional: She appears well-developed and well-nourished.  Cardiovascular: Normal rate and regular rhythm.   Pulmonary/Chest: Effort normal and breath sounds normal. No respiratory distress. She has no wheezes. She has no rales.  Neurological: She is alert.  Psychiatric: She has a normal mood and affect. Her behavior is normal.  Judgment and thought content normal.       Assessment:     Anxiety with probable mild obsessive-compulsive disorder    Plan:     -Continue with cognitive behavioral therapy -Continue sertraline at current dose. We discussed possible titration if she does not see further improvement with the above. She wishes to continue current dose for now  Kristian CoveyBruce W Deni Lefever MD [facility]

## 2016-05-07 NOTE — Progress Notes (Signed)
Pre visit review using our clinic review tool, if applicable. No additional management support is needed unless otherwise documented below in the visit note. 

## 2016-05-24 ENCOUNTER — Ambulatory Visit: Payer: BLUE CROSS/BLUE SHIELD | Admitting: Psychology

## 2016-05-24 ENCOUNTER — Ambulatory Visit (INDEPENDENT_AMBULATORY_CARE_PROVIDER_SITE_OTHER): Payer: BLUE CROSS/BLUE SHIELD | Admitting: Psychology

## 2016-05-24 DIAGNOSIS — H5203 Hypermetropia, bilateral: Secondary | ICD-10-CM | POA: Diagnosis not present

## 2016-05-24 DIAGNOSIS — H524 Presbyopia: Secondary | ICD-10-CM | POA: Diagnosis not present

## 2016-05-24 DIAGNOSIS — F422 Mixed obsessional thoughts and acts: Secondary | ICD-10-CM | POA: Diagnosis not present

## 2016-05-24 DIAGNOSIS — F411 Generalized anxiety disorder: Secondary | ICD-10-CM | POA: Diagnosis not present

## 2016-05-31 ENCOUNTER — Ambulatory Visit: Payer: BLUE CROSS/BLUE SHIELD | Admitting: Psychology

## 2016-05-31 DIAGNOSIS — J00 Acute nasopharyngitis [common cold]: Secondary | ICD-10-CM | POA: Diagnosis not present

## 2016-06-05 DIAGNOSIS — Z01419 Encounter for gynecological examination (general) (routine) without abnormal findings: Secondary | ICD-10-CM | POA: Diagnosis not present

## 2016-06-05 DIAGNOSIS — Z30431 Encounter for routine checking of intrauterine contraceptive device: Secondary | ICD-10-CM | POA: Diagnosis not present

## 2016-06-05 DIAGNOSIS — R110 Nausea: Secondary | ICD-10-CM | POA: Diagnosis not present

## 2016-06-21 ENCOUNTER — Ambulatory Visit: Payer: BLUE CROSS/BLUE SHIELD | Admitting: Psychology

## 2016-07-24 ENCOUNTER — Ambulatory Visit (INDEPENDENT_AMBULATORY_CARE_PROVIDER_SITE_OTHER): Payer: BLUE CROSS/BLUE SHIELD | Admitting: Psychology

## 2016-07-24 DIAGNOSIS — F411 Generalized anxiety disorder: Secondary | ICD-10-CM

## 2016-07-24 DIAGNOSIS — F422 Mixed obsessional thoughts and acts: Secondary | ICD-10-CM | POA: Diagnosis not present

## 2016-08-07 ENCOUNTER — Ambulatory Visit: Payer: Self-pay | Admitting: Psychology

## 2016-08-28 ENCOUNTER — Ambulatory Visit: Payer: BLUE CROSS/BLUE SHIELD | Admitting: Psychology

## 2016-09-18 ENCOUNTER — Ambulatory Visit: Payer: BLUE CROSS/BLUE SHIELD | Admitting: Psychology

## 2016-09-20 ENCOUNTER — Encounter: Payer: Self-pay | Admitting: Family Medicine

## 2016-09-24 ENCOUNTER — Ambulatory Visit (INDEPENDENT_AMBULATORY_CARE_PROVIDER_SITE_OTHER): Payer: BLUE CROSS/BLUE SHIELD | Admitting: Psychology

## 2016-09-24 DIAGNOSIS — F422 Mixed obsessional thoughts and acts: Secondary | ICD-10-CM

## 2016-09-24 DIAGNOSIS — F411 Generalized anxiety disorder: Secondary | ICD-10-CM | POA: Diagnosis not present

## 2016-10-15 ENCOUNTER — Ambulatory Visit (INDEPENDENT_AMBULATORY_CARE_PROVIDER_SITE_OTHER): Payer: BLUE CROSS/BLUE SHIELD | Admitting: Psychology

## 2016-10-15 DIAGNOSIS — F411 Generalized anxiety disorder: Secondary | ICD-10-CM

## 2016-10-15 DIAGNOSIS — F422 Mixed obsessional thoughts and acts: Secondary | ICD-10-CM | POA: Diagnosis not present

## 2016-10-29 ENCOUNTER — Ambulatory Visit (INDEPENDENT_AMBULATORY_CARE_PROVIDER_SITE_OTHER): Payer: BLUE CROSS/BLUE SHIELD | Admitting: Psychology

## 2016-10-29 DIAGNOSIS — F422 Mixed obsessional thoughts and acts: Secondary | ICD-10-CM | POA: Diagnosis not present

## 2016-10-29 DIAGNOSIS — F411 Generalized anxiety disorder: Secondary | ICD-10-CM | POA: Diagnosis not present

## 2016-11-19 ENCOUNTER — Ambulatory Visit: Payer: Self-pay | Admitting: Psychology

## 2016-12-03 ENCOUNTER — Ambulatory Visit: Payer: Self-pay | Admitting: Psychology

## 2016-12-17 ENCOUNTER — Ambulatory Visit: Payer: BLUE CROSS/BLUE SHIELD | Admitting: Psychology

## 2016-12-17 DIAGNOSIS — F411 Generalized anxiety disorder: Secondary | ICD-10-CM

## 2016-12-17 DIAGNOSIS — F422 Mixed obsessional thoughts and acts: Secondary | ICD-10-CM | POA: Diagnosis not present

## 2017-01-29 ENCOUNTER — Ambulatory Visit: Payer: BLUE CROSS/BLUE SHIELD | Admitting: Psychology

## 2017-02-07 ENCOUNTER — Ambulatory Visit: Payer: BLUE CROSS/BLUE SHIELD | Admitting: Psychology

## 2017-02-07 DIAGNOSIS — F422 Mixed obsessional thoughts and acts: Secondary | ICD-10-CM

## 2017-02-07 DIAGNOSIS — F411 Generalized anxiety disorder: Secondary | ICD-10-CM

## 2017-02-26 ENCOUNTER — Ambulatory Visit: Payer: BLUE CROSS/BLUE SHIELD | Admitting: Psychology

## 2017-02-26 DIAGNOSIS — F411 Generalized anxiety disorder: Secondary | ICD-10-CM

## 2017-02-26 DIAGNOSIS — F422 Mixed obsessional thoughts and acts: Secondary | ICD-10-CM

## 2017-03-12 ENCOUNTER — Ambulatory Visit: Payer: BLUE CROSS/BLUE SHIELD | Admitting: Psychology

## 2017-03-12 DIAGNOSIS — F411 Generalized anxiety disorder: Secondary | ICD-10-CM

## 2017-03-12 DIAGNOSIS — F422 Mixed obsessional thoughts and acts: Secondary | ICD-10-CM

## 2017-03-14 DIAGNOSIS — S61200A Unspecified open wound of right index finger without damage to nail, initial encounter: Secondary | ICD-10-CM | POA: Diagnosis not present

## 2017-03-26 ENCOUNTER — Ambulatory Visit: Payer: BLUE CROSS/BLUE SHIELD | Admitting: Psychology

## 2017-03-26 DIAGNOSIS — F422 Mixed obsessional thoughts and acts: Secondary | ICD-10-CM

## 2017-03-26 DIAGNOSIS — F411 Generalized anxiety disorder: Secondary | ICD-10-CM | POA: Diagnosis not present

## 2017-04-09 ENCOUNTER — Ambulatory Visit: Payer: Self-pay | Admitting: Psychology

## 2017-04-23 ENCOUNTER — Ambulatory Visit: Payer: BLUE CROSS/BLUE SHIELD | Admitting: Psychology

## 2017-04-23 DIAGNOSIS — F422 Mixed obsessional thoughts and acts: Secondary | ICD-10-CM | POA: Diagnosis not present

## 2017-04-23 DIAGNOSIS — F411 Generalized anxiety disorder: Secondary | ICD-10-CM

## 2017-05-07 ENCOUNTER — Ambulatory Visit: Payer: BLUE CROSS/BLUE SHIELD | Admitting: Psychology

## 2017-05-07 DIAGNOSIS — F422 Mixed obsessional thoughts and acts: Secondary | ICD-10-CM | POA: Diagnosis not present

## 2017-05-07 DIAGNOSIS — F411 Generalized anxiety disorder: Secondary | ICD-10-CM | POA: Diagnosis not present

## 2017-05-21 ENCOUNTER — Telehealth: Payer: Self-pay | Admitting: Family Medicine

## 2017-05-21 ENCOUNTER — Ambulatory Visit: Payer: BLUE CROSS/BLUE SHIELD | Admitting: Psychology

## 2017-05-21 DIAGNOSIS — F422 Mixed obsessional thoughts and acts: Secondary | ICD-10-CM

## 2017-05-21 DIAGNOSIS — F411 Generalized anxiety disorder: Secondary | ICD-10-CM | POA: Diagnosis not present

## 2017-05-21 MED ORDER — ELETRIPTAN HYDROBROMIDE 40 MG PO TABS: 40.0000 mg | ORAL_TABLET | ORAL | 0 refills | Status: DC | PRN

## 2017-05-21 MED ORDER — ELETRIPTAN HYDROBROMIDE 40 MG PO TABS: 40.0000 mg | ORAL_TABLET | ORAL | 0 refills | Status: AC | PRN

## 2017-05-21 NOTE — Telephone Encounter (Signed)
Hard copy Rx faxed to pharmacy

## 2017-05-21 NOTE — Telephone Encounter (Signed)
Pt is in the office today requesting a refill on eletriptan 40 MG  Pharm:  [facility] on [address] in [place], [place]

## 2017-05-21 NOTE — Telephone Encounter (Signed)
Refills OK. 

## 2017-05-22 DIAGNOSIS — R0982 Postnasal drip: Secondary | ICD-10-CM | POA: Diagnosis not present

## 2017-05-22 DIAGNOSIS — J019 Acute sinusitis, unspecified: Secondary | ICD-10-CM | POA: Diagnosis not present

## 2017-06-04 ENCOUNTER — Ambulatory Visit: Payer: BLUE CROSS/BLUE SHIELD | Admitting: Psychology

## 2017-06-04 DIAGNOSIS — F411 Generalized anxiety disorder: Secondary | ICD-10-CM | POA: Diagnosis not present

## 2017-06-04 DIAGNOSIS — F422 Mixed obsessional thoughts and acts: Secondary | ICD-10-CM

## 2017-06-07 DIAGNOSIS — Z01419 Encounter for gynecological examination (general) (routine) without abnormal findings: Secondary | ICD-10-CM | POA: Diagnosis not present

## 2017-06-07 DIAGNOSIS — Z975 Presence of (intrauterine) contraceptive device: Secondary | ICD-10-CM | POA: Diagnosis not present

## 2017-06-18 ENCOUNTER — Ambulatory Visit: Payer: Self-pay | Admitting: Psychology

## 2017-07-02 ENCOUNTER — Ambulatory Visit: Payer: BLUE CROSS/BLUE SHIELD | Admitting: Psychology

## 2017-07-05 ENCOUNTER — Ambulatory Visit: Payer: BLUE CROSS/BLUE SHIELD | Admitting: Psychology

## 2017-07-05 DIAGNOSIS — F422 Mixed obsessional thoughts and acts: Secondary | ICD-10-CM | POA: Diagnosis not present

## 2017-07-05 DIAGNOSIS — F411 Generalized anxiety disorder: Secondary | ICD-10-CM

## 2017-07-16 ENCOUNTER — Ambulatory Visit: Payer: BLUE CROSS/BLUE SHIELD | Admitting: Psychology

## 2017-07-16 DIAGNOSIS — F422 Mixed obsessional thoughts and acts: Secondary | ICD-10-CM

## 2017-07-16 DIAGNOSIS — F411 Generalized anxiety disorder: Secondary | ICD-10-CM | POA: Diagnosis not present

## 2017-08-13 ENCOUNTER — Ambulatory Visit (INDEPENDENT_AMBULATORY_CARE_PROVIDER_SITE_OTHER): Payer: BLUE CROSS/BLUE SHIELD | Admitting: Psychology

## 2017-08-13 DIAGNOSIS — F411 Generalized anxiety disorder: Secondary | ICD-10-CM | POA: Diagnosis not present

## 2017-08-13 DIAGNOSIS — F422 Mixed obsessional thoughts and acts: Secondary | ICD-10-CM | POA: Diagnosis not present

## 2017-08-27 ENCOUNTER — Ambulatory Visit (INDEPENDENT_AMBULATORY_CARE_PROVIDER_SITE_OTHER): Payer: BLUE CROSS/BLUE SHIELD | Admitting: Psychology

## 2017-08-27 DIAGNOSIS — F422 Mixed obsessional thoughts and acts: Secondary | ICD-10-CM

## 2017-08-27 DIAGNOSIS — F411 Generalized anxiety disorder: Secondary | ICD-10-CM | POA: Diagnosis not present

## 2017-09-10 ENCOUNTER — Ambulatory Visit: Payer: Self-pay | Admitting: Psychology

## 2017-09-24 ENCOUNTER — Ambulatory Visit: Payer: Self-pay | Admitting: Psychology

## 2017-09-26 ENCOUNTER — Ambulatory Visit: Payer: BLUE CROSS/BLUE SHIELD | Admitting: Psychology

## 2017-10-08 ENCOUNTER — Ambulatory Visit (INDEPENDENT_AMBULATORY_CARE_PROVIDER_SITE_OTHER): Payer: BLUE CROSS/BLUE SHIELD | Admitting: Psychology

## 2017-10-08 DIAGNOSIS — F411 Generalized anxiety disorder: Secondary | ICD-10-CM

## 2017-10-08 DIAGNOSIS — F422 Mixed obsessional thoughts and acts: Secondary | ICD-10-CM | POA: Diagnosis not present

## 2017-10-22 ENCOUNTER — Ambulatory Visit (INDEPENDENT_AMBULATORY_CARE_PROVIDER_SITE_OTHER): Payer: BLUE CROSS/BLUE SHIELD | Admitting: Psychology

## 2017-10-22 DIAGNOSIS — F411 Generalized anxiety disorder: Secondary | ICD-10-CM

## 2017-10-22 DIAGNOSIS — F422 Mixed obsessional thoughts and acts: Secondary | ICD-10-CM

## 2017-11-05 ENCOUNTER — Ambulatory Visit: Payer: BLUE CROSS/BLUE SHIELD | Admitting: Psychology

## 2017-11-19 ENCOUNTER — Ambulatory Visit: Payer: BLUE CROSS/BLUE SHIELD | Admitting: Psychology

## 2017-12-20 ENCOUNTER — Ambulatory Visit (INDEPENDENT_AMBULATORY_CARE_PROVIDER_SITE_OTHER): Payer: BLUE CROSS/BLUE SHIELD | Admitting: Psychology

## 2017-12-20 DIAGNOSIS — F411 Generalized anxiety disorder: Secondary | ICD-10-CM

## 2017-12-20 DIAGNOSIS — F422 Mixed obsessional thoughts and acts: Secondary | ICD-10-CM

## 2018-01-10 ENCOUNTER — Ambulatory Visit: Payer: BLUE CROSS/BLUE SHIELD | Admitting: Psychology

## 2018-01-31 ENCOUNTER — Ambulatory Visit (INDEPENDENT_AMBULATORY_CARE_PROVIDER_SITE_OTHER): Payer: BLUE CROSS/BLUE SHIELD | Admitting: Psychology

## 2018-01-31 DIAGNOSIS — F422 Mixed obsessional thoughts and acts: Secondary | ICD-10-CM | POA: Diagnosis not present

## 2018-01-31 DIAGNOSIS — F411 Generalized anxiety disorder: Secondary | ICD-10-CM

## 2018-02-21 ENCOUNTER — Ambulatory Visit (INDEPENDENT_AMBULATORY_CARE_PROVIDER_SITE_OTHER): Payer: BLUE CROSS/BLUE SHIELD | Admitting: Psychology

## 2018-02-21 DIAGNOSIS — F411 Generalized anxiety disorder: Secondary | ICD-10-CM | POA: Diagnosis not present

## 2018-02-21 DIAGNOSIS — F422 Mixed obsessional thoughts and acts: Secondary | ICD-10-CM

## 2018-03-07 ENCOUNTER — Ambulatory Visit: Payer: Self-pay | Admitting: Psychology

## 2018-03-21 ENCOUNTER — Ambulatory Visit (INDEPENDENT_AMBULATORY_CARE_PROVIDER_SITE_OTHER): Payer: BLUE CROSS/BLUE SHIELD | Admitting: Psychology

## 2018-03-21 DIAGNOSIS — F411 Generalized anxiety disorder: Secondary | ICD-10-CM

## 2018-03-21 DIAGNOSIS — F422 Mixed obsessional thoughts and acts: Secondary | ICD-10-CM | POA: Diagnosis not present

## 2018-04-15 ENCOUNTER — Ambulatory Visit (INDEPENDENT_AMBULATORY_CARE_PROVIDER_SITE_OTHER): Payer: BLUE CROSS/BLUE SHIELD | Admitting: Psychology

## 2018-04-15 DIAGNOSIS — F422 Mixed obsessional thoughts and acts: Secondary | ICD-10-CM

## 2018-04-15 DIAGNOSIS — F411 Generalized anxiety disorder: Secondary | ICD-10-CM

## 2018-04-18 ENCOUNTER — Ambulatory Visit: Payer: BLUE CROSS/BLUE SHIELD | Admitting: Psychology

## 2018-05-01 ENCOUNTER — Ambulatory Visit (INDEPENDENT_AMBULATORY_CARE_PROVIDER_SITE_OTHER): Payer: BLUE CROSS/BLUE SHIELD | Admitting: Psychology

## 2018-05-01 DIAGNOSIS — F422 Mixed obsessional thoughts and acts: Secondary | ICD-10-CM

## 2018-05-01 DIAGNOSIS — F411 Generalized anxiety disorder: Secondary | ICD-10-CM | POA: Diagnosis not present

## 2018-05-02 ENCOUNTER — Ambulatory Visit: Payer: BLUE CROSS/BLUE SHIELD | Admitting: Psychology

## 2018-05-15 ENCOUNTER — Ambulatory Visit (INDEPENDENT_AMBULATORY_CARE_PROVIDER_SITE_OTHER): Payer: BLUE CROSS/BLUE SHIELD | Admitting: Psychology

## 2018-05-15 DIAGNOSIS — F411 Generalized anxiety disorder: Secondary | ICD-10-CM | POA: Diagnosis not present

## 2018-05-15 DIAGNOSIS — F422 Mixed obsessional thoughts and acts: Secondary | ICD-10-CM

## 2018-05-29 ENCOUNTER — Ambulatory Visit (INDEPENDENT_AMBULATORY_CARE_PROVIDER_SITE_OTHER): Payer: BLUE CROSS/BLUE SHIELD | Admitting: Psychology

## 2018-05-29 DIAGNOSIS — F411 Generalized anxiety disorder: Secondary | ICD-10-CM | POA: Diagnosis not present

## 2018-05-29 DIAGNOSIS — F422 Mixed obsessional thoughts and acts: Secondary | ICD-10-CM | POA: Diagnosis not present

## 2018-05-30 ENCOUNTER — Ambulatory Visit: Payer: BLUE CROSS/BLUE SHIELD | Admitting: Psychology

## 2018-06-19 ENCOUNTER — Ambulatory Visit (INDEPENDENT_AMBULATORY_CARE_PROVIDER_SITE_OTHER): Payer: Self-pay | Admitting: Psychology

## 2018-06-19 DIAGNOSIS — F422 Mixed obsessional thoughts and acts: Secondary | ICD-10-CM

## 2018-06-19 DIAGNOSIS — F411 Generalized anxiety disorder: Secondary | ICD-10-CM

## 2018-07-17 ENCOUNTER — Ambulatory Visit (INDEPENDENT_AMBULATORY_CARE_PROVIDER_SITE_OTHER): Payer: Self-pay | Admitting: Psychology

## 2018-07-17 DIAGNOSIS — F411 Generalized anxiety disorder: Secondary | ICD-10-CM

## 2018-07-17 DIAGNOSIS — F422 Mixed obsessional thoughts and acts: Secondary | ICD-10-CM

## 2018-07-31 ENCOUNTER — Ambulatory Visit (INDEPENDENT_AMBULATORY_CARE_PROVIDER_SITE_OTHER): Payer: Self-pay | Admitting: Psychology

## 2018-07-31 DIAGNOSIS — F411 Generalized anxiety disorder: Secondary | ICD-10-CM

## 2018-07-31 DIAGNOSIS — F422 Mixed obsessional thoughts and acts: Secondary | ICD-10-CM

## 2018-08-21 ENCOUNTER — Ambulatory Visit (INDEPENDENT_AMBULATORY_CARE_PROVIDER_SITE_OTHER): Payer: Self-pay | Admitting: Psychology

## 2018-08-21 DIAGNOSIS — F422 Mixed obsessional thoughts and acts: Secondary | ICD-10-CM

## 2018-09-09 ENCOUNTER — Ambulatory Visit (INDEPENDENT_AMBULATORY_CARE_PROVIDER_SITE_OTHER): Payer: Self-pay | Admitting: Psychology

## 2018-09-09 DIAGNOSIS — F422 Mixed obsessional thoughts and acts: Secondary | ICD-10-CM

## 2018-09-09 DIAGNOSIS — F411 Generalized anxiety disorder: Secondary | ICD-10-CM

## 2018-09-23 ENCOUNTER — Ambulatory Visit (INDEPENDENT_AMBULATORY_CARE_PROVIDER_SITE_OTHER): Payer: Self-pay | Admitting: Psychology

## 2018-09-23 DIAGNOSIS — F411 Generalized anxiety disorder: Secondary | ICD-10-CM

## 2018-09-23 DIAGNOSIS — F422 Mixed obsessional thoughts and acts: Secondary | ICD-10-CM
